# Patient Record
Sex: Male | Born: 1995 | Race: White | Hispanic: No | Smoking: Current some day smoker
Health system: Southern US, Community
[De-identification: ages and names within clinical notes are randomized; demographics above are authoritative.]

## PROBLEM LIST (undated history)

## (undated) DIAGNOSIS — J45909 Unspecified asthma, uncomplicated: Secondary | ICD-10-CM

---
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
## 2018-01-20 IMAGING — DX DG ANKLE COMPLETE 3+V*R*
3 series · 3 of 3 positions shown · non-contrast
Comparison: None.

CLINICAL DATA: Laceration right heel

EXAM:
RIGHT ANKLE - COMPLETE 3+ VIEW

[ankle ap]
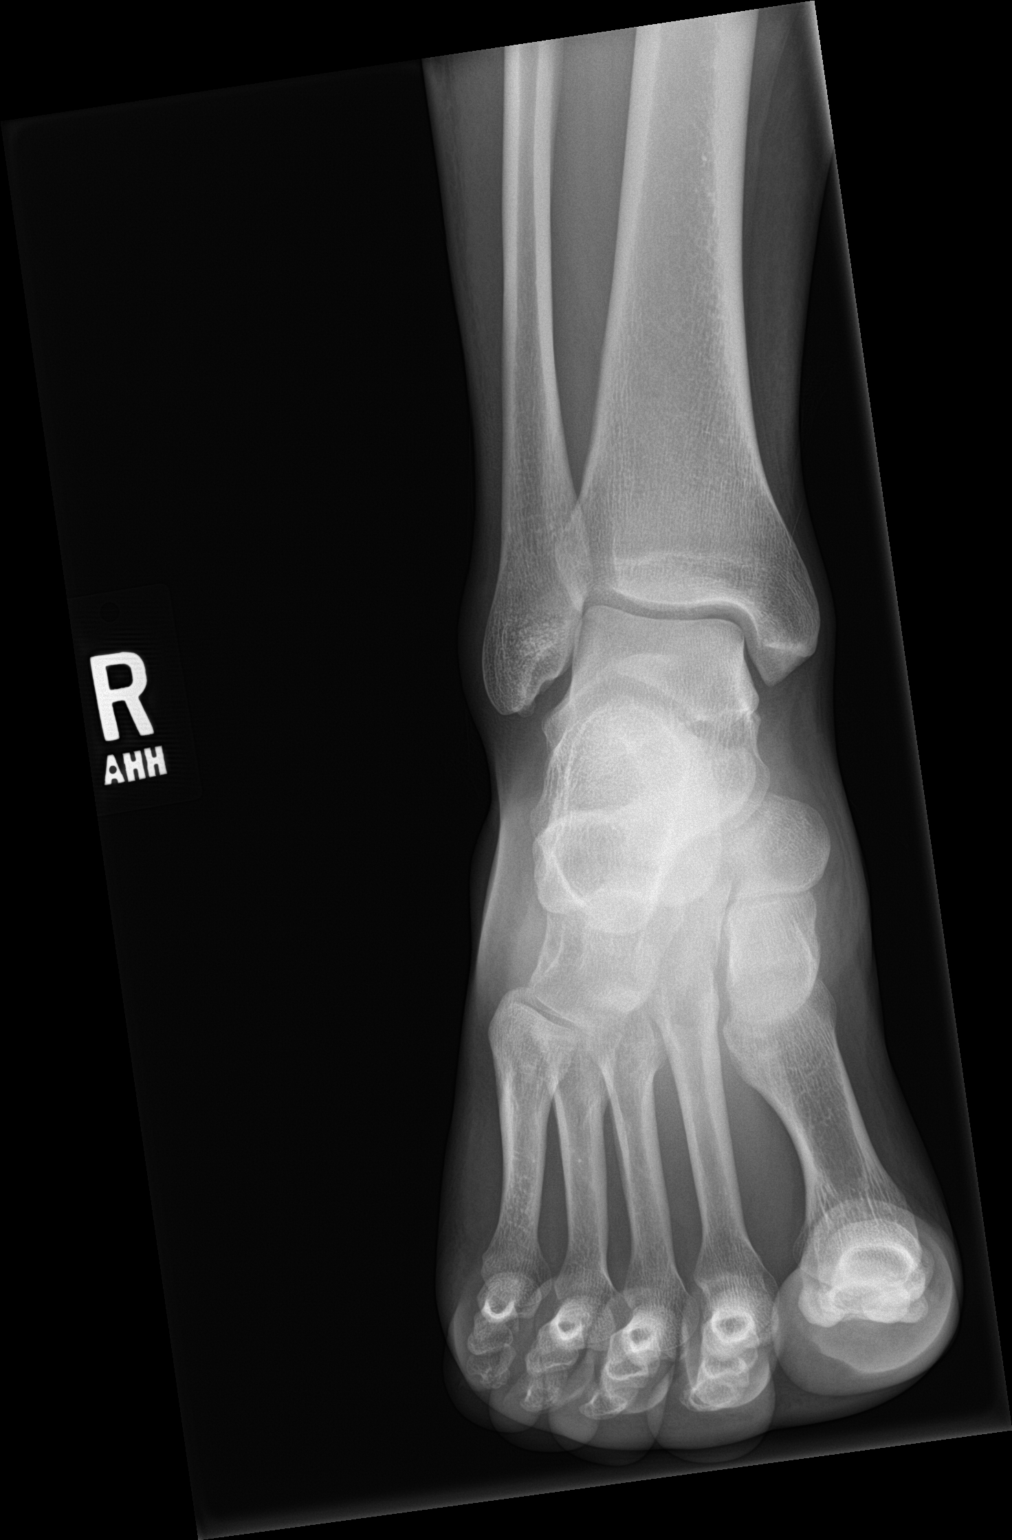

[ankle obl]
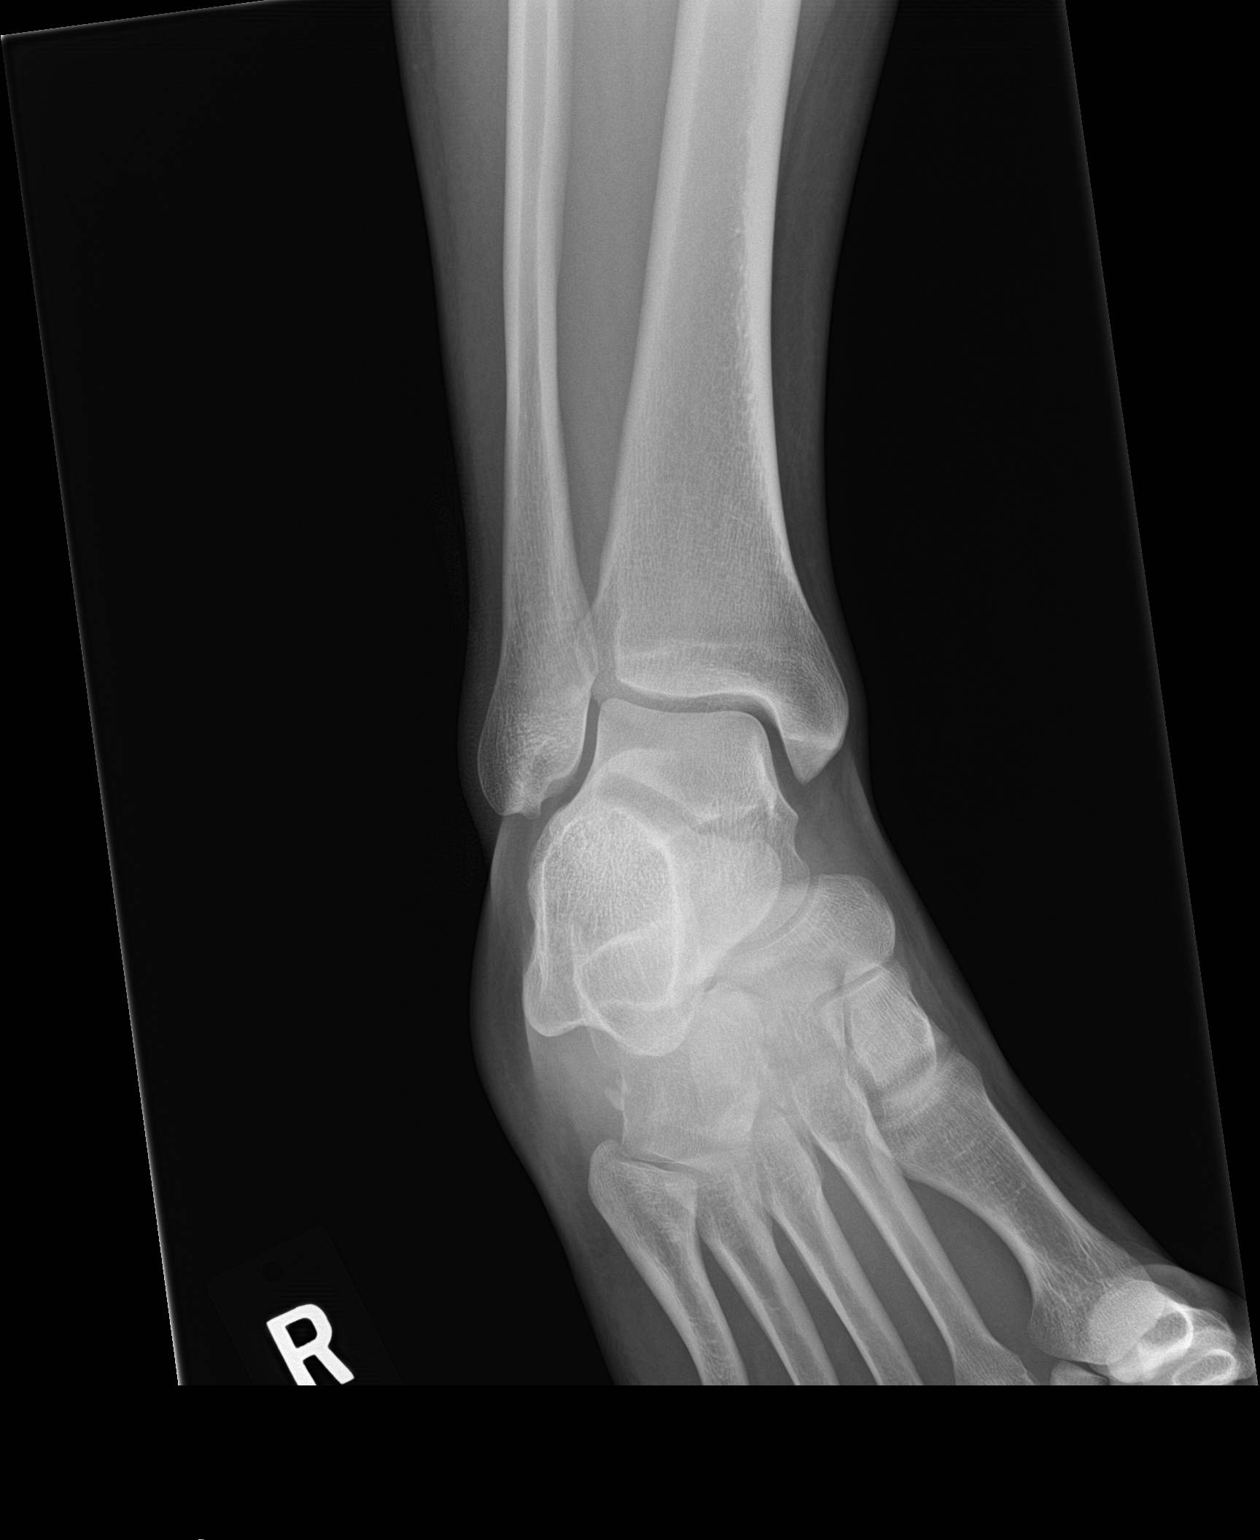

[ankle lat]
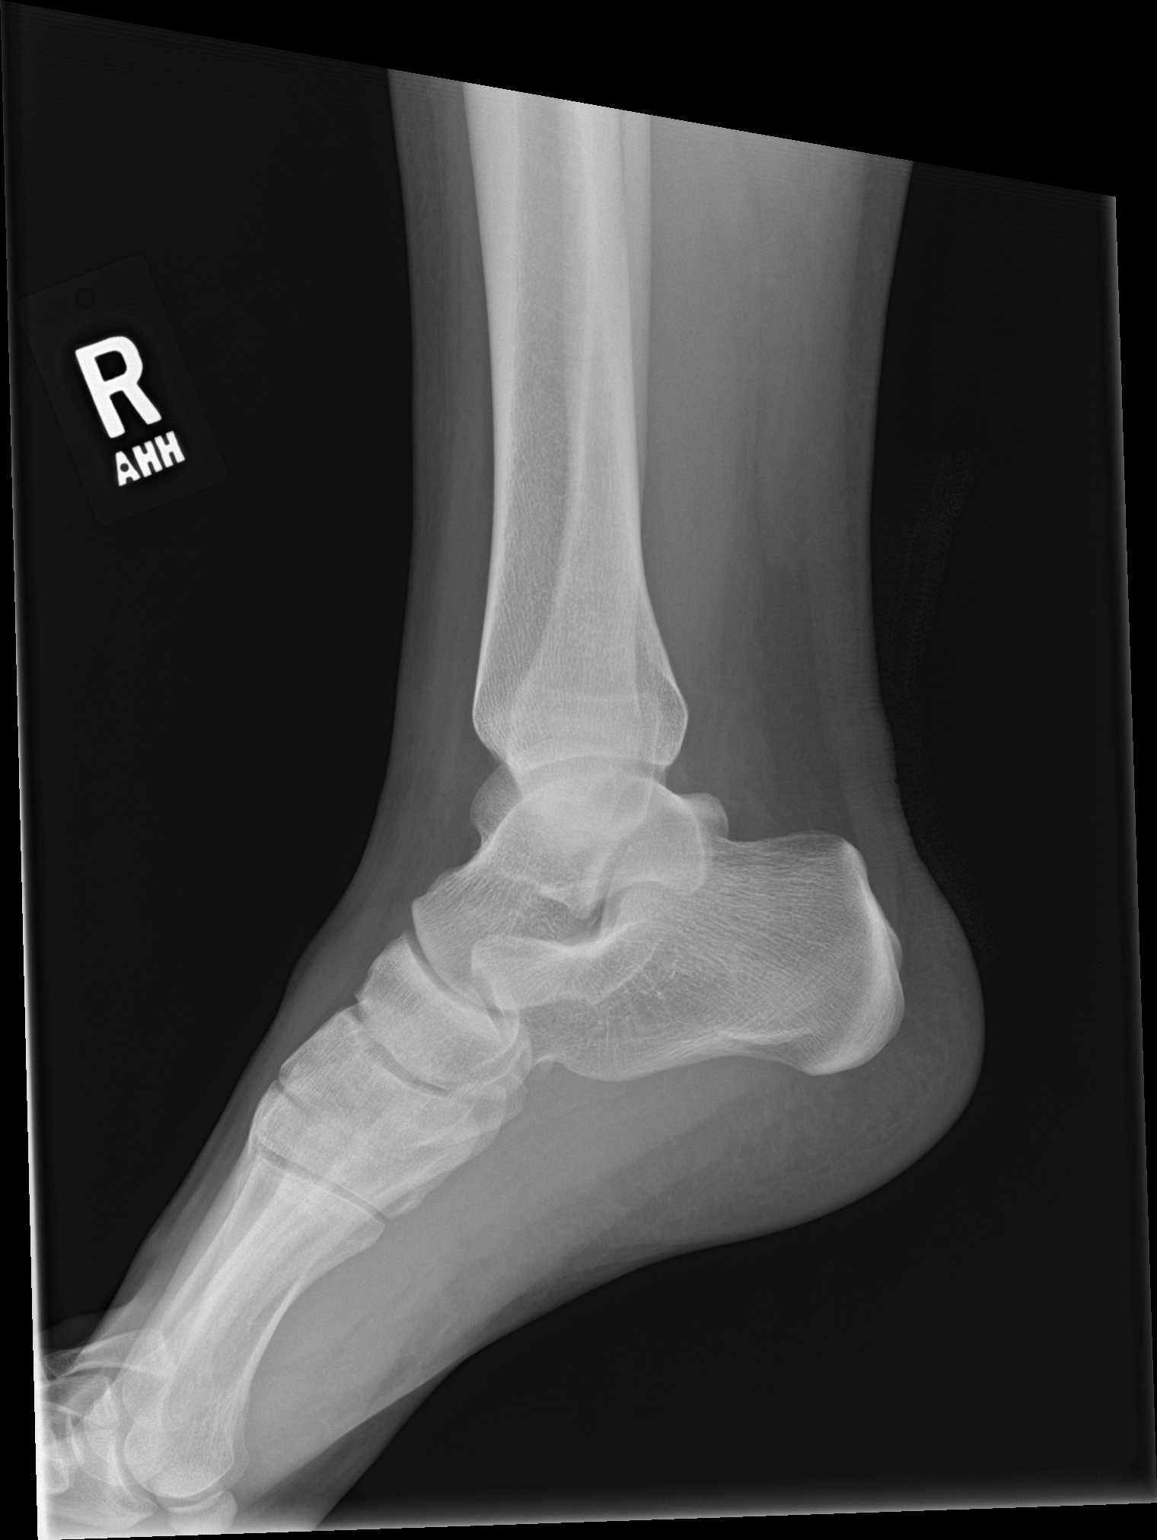

[3 of 3 positions shown; findings below may reference images not displayed]

FINDINGS: There is no evidence of fracture, dislocation, or joint effusion.
There is no evidence of arthropathy or other focal bone abnormality.
Soft tissues are unremarkable. Negative foreign body.
IMPRESSION: Negative.

## 2023-08-26 ENCOUNTER — Other Ambulatory Visit: Payer: Self-pay

## 2023-08-26 ENCOUNTER — Ambulatory Visit (INDEPENDENT_AMBULATORY_CARE_PROVIDER_SITE_OTHER): Payer: 59 | Admitting: Internal Medicine

## 2023-08-26 ENCOUNTER — Encounter: Payer: Self-pay | Admitting: Internal Medicine

## 2023-08-26 VITALS — BP 150/102 | HR 96 | Temp 98.1°F | Resp 16 | Ht 67.72 in | Wt 247.1 lb

## 2023-08-26 DIAGNOSIS — J3089 Other allergic rhinitis: Secondary | ICD-10-CM

## 2023-08-26 DIAGNOSIS — J4520 Mild intermittent asthma, uncomplicated: Secondary | ICD-10-CM | POA: Diagnosis not present

## 2023-08-26 MED ORDER — ALBUTEROL SULFATE HFA 108 (90 BASE) MCG/ACT IN AERS: 2.0000 | INHALATION_SPRAY | RESPIRATORY_TRACT | 1 refills | Status: AC | PRN

## 2023-08-26 MED ORDER — CETIRIZINE HCL 10 MG PO TABS: 10.0000 mg | ORAL_TABLET | Freq: Every day | ORAL | 5 refills | Status: AC | PRN

## 2023-08-26 MED ORDER — ALBUTEROL SULFATE (2.5 MG/3ML) 0.083% IN NEBU: 2.5000 mg | INHALATION_SOLUTION | Freq: Four times a day (QID) | RESPIRATORY_TRACT | 1 refills | Status: AC | PRN

## 2023-08-26 NOTE — Progress Notes (Signed)
NEW PATIENT  Date of Service/Encounter:  08/26/23  Consult requested by: No primary care provider on file.   Subjective:   Pedro Fox (DOB: [birth year]) is a 28 y.o. male who presents to the clinic on 08/26/2023 with a chief complaint of Allergies (Has seasonal allergies that come up during spring.) and Asthma (Says his asthma attacks started up when he started using the gas heater in his new apartment. Has an albuterol inhaler and a nebulizer and states that the nebulizer works better.) .    History obtained from: chart review and patient.   Asthma:  Diagnosed at age 10.  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  Has trouble with SOB/chest tightness at the time.  Uses Albuterol 1x/week or less. No urgent care/ER visits/oral prednisone in last year.  Once a week or less daytime symptoms in past month, none nighttime awakenings in past month Using rescue inhaler: about once a week Limitations to daily activity: none 0 ED visits/UC visits and 0 oral steroids in the past year 0 number of lifetime hospitalizations, 0 number of lifetime intubations.  Identified Triggers: gas heater in apartment, dust, allergies Prior PFTs or spirometry: none available for review  Previously used therapies: never on controller inhaler  Current regimen:  Maintenance: none  Rescue: Albuterol 2 puffs or nebulizer q4-6 hrs PRN  Rhinitis:  Started since he was young, age 20-7.  Symptoms include: nasal congestion, rhinorrhea, post nasal drainage, and sneezing  Occurs seasonally-Spring Potential triggers: not sure  Treatments tried:  PRN OTC meds  Previous allergy testing: no History of sinus surgery: no Nonallergic triggers: gas heater     Reviewed:  08/26/2021: underwent TB screening with [facility], negative results.   04/27/2020: seen by Katrinka Blazing PA for annual physical exam.   No PMH/PSH/Meds.  Past Medical History: Past Medical History:  Diagnosis Date   Asthma    pt states it is only w/ pollen   Past Surgical History: History reviewed. No pertinent surgical history.  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 If you have any questions, ask your nurse or doctor.          albuterol 108 (90 Base) MCG/ACT inhaler Commonly known as: VENTOLIN HFA Inhale 2 puffs into the lungs every 4 (four) hours as needed for shortness of breath or wheezing.         REVIEW OF SYSTEMS: Pertinent positives and negatives discussed in HPI.   Objective:   Physical Exam: BP (!) 144/110 (BP Location: Right Arm, Patient Position: Sitting, Cuff Size: Normal)   Pulse 96   Temp 98.1 F (36.7 C) (Temporal)   Resp 16   Ht 5' 7.72" (1.72 m)   Wt 247 lb 1.6 oz (112.1 kg)   SpO2 97%   BMI 37.89 kg/m  Body mass index is 37.89 kg/m. GEN: alert, well developed HEENT: clear conjunctiva, nose with + mild inferior turbinate  hypertrophy, pink nasal mucosa, slight clear rhinorrhea, no cobblestoning HEART: regular rate and rhythm, no murmur LUNGS: clear to auscultation bilaterally, no coughing, unlabored respiration ABDOMEN: soft, non distended  SKIN: no rashes or lesions  Spirometry:  Tracings reviewed. His effort: Good reproducible efforts. FVC: 5.05L, 99% predicted FEV1: 3.59L, 84% predicted FEV1/FVC ratio: 71% Interpretation: Spirometry consistent with normal pattern.  Please see scanned spirometry results for details.  Assessment:   1. Other allergic rhinitis   2. Mild intermittent asthma  without complication     Plan/Recommendations:  Mild Intermittent Asthma: - MDI technique discussed.  Spirometry today was normal. Infrequent symptoms with weekly or less Albuterol use.   - Keep track of symptoms and Albuterol use.  - Rescue inhaler: Albuterol 2 puffs or 1 vial via nebulizer every 4-6 hours as needed for respiratory symptoms of shortness of breath, or wheezing Asthma control goals:  Full participation in all desired activities (may need albuterol before activity) Albuterol use two times or less a week on average (not counting use with activity) Cough interfering with sleep two times or less a month Oral steroids no more than once a year No hospitalizations   Other Allergic Rhinitis: - Due to turbinate hypertrophy, seasonal symptoms and unresponsive to over the counter meds, will perform skin testing to identify aeroallergen triggers.   - Use nasal saline rinses before nose sprays such as with Neilmed Sinus Rinse.  Use distilled water.   - Use Zyrtec 10 mg daily as needed for runny nose, sneezing, itchy watery eyes.    Hold all anti-histamines (Xyzal, Allegra, Zyrtec, Claritin, Benadryl, Pepcid) 3 days prior to next visit.    Follow up: 130 PM on 1/29 for skin testing 1-55, IDs okay    Alesia Morin, MD [facility]

## 2023-08-26 NOTE — Patient Instructions (Addendum)
Mild Intermittent Asthma: - Keep track of symptoms and Albuterol use.  - Rescue inhaler: Albuterol 2 puffs or 1 vial via nebulizer every 4-6 hours as needed for respiratory symptoms of shortness of breath, or wheezing Asthma control goals:  Full participation in all desired activities (may need albuterol before activity) Albuterol use two times or less a week on average (not counting use with activity) Cough interfering with sleep two times or less a month Oral steroids no more than once a year No hospitalizations   Other Allergic Rhinitis: - Use nasal saline rinses before nose sprays such as with Neilmed Sinus Rinse.  Use distilled water.   - Use Zyrtec 10 mg daily as needed for runny nose, sneezing, itchy watery eyes.    Hold all anti-histamines (Xyzal, Allegra, Zyrtec, Claritin, Benadryl, Pepcid) 3 days prior to next visit.    Follow up: 130 PM on 1/29 for skin testing 1-55

## 2023-09-02 ENCOUNTER — Ambulatory Visit (INDEPENDENT_AMBULATORY_CARE_PROVIDER_SITE_OTHER): Payer: 59 | Admitting: Internal Medicine

## 2023-09-02 DIAGNOSIS — J3081 Allergic rhinitis due to animal (cat) (dog) hair and dander: Secondary | ICD-10-CM | POA: Diagnosis not present

## 2023-09-02 DIAGNOSIS — J301 Allergic rhinitis due to pollen: Secondary | ICD-10-CM | POA: Diagnosis not present

## 2023-09-02 DIAGNOSIS — J3089 Other allergic rhinitis: Secondary | ICD-10-CM

## 2023-09-02 MED ORDER — AZELASTINE HCL 0.1 % NA SOLN: 2.0000 | Freq: Two times a day (BID) | NASAL | 5 refills | Status: AC | PRN

## 2023-09-02 NOTE — Patient Instructions (Addendum)
Allergic Rhinitis: - Positive skin test 08/2023: trees, grasses, weeds, dusts mites, cats  - Avoidance measures discussed. - Use nasal saline rinses before nose sprays such as with Neilmed Sinus Rinse.  Use distilled water.   - Use Azelastine 2 sprays each nostril twice daily as needed for runny nose, drainage, sneezing, congestion. Aim upward and outward. - Use Zyrtec 10 mg daily.  - Consider allergy shots as long term control of your symptoms by teaching your immune system to be more tolerant of your allergy triggers  Mild Intermittent Asthma: - Keep track of symptoms and Albuterol use.  - Rescue inhaler: Albuterol 2 puffs or 1 vial via nebulizer every 4-6 hours as needed for respiratory symptoms of shortness of breath, or wheezing Asthma control goals:  Full participation in all desired activities (may need albuterol before activity) Albuterol use two times or less a week on average (not counting use with activity) Cough interfering with sleep two times or less a month Oral steroids no more than once a year No hospitalizations   ALLERGEN AVOIDANCE MEASURES   Dust Mites Use central air conditioning and heat; and change the filter monthly.  Pleated filters work better than mesh filters.  Electrostatic filters may also be used; wash the filter monthly.  Window air conditioners may be used, but do not clean the air as well as a central air conditioner.  Change or wash the filter monthly. Keep windows closed.  Do not use attic fans.   Encase the mattress, box springs and pillows with zippered, dust proof covers. Wash the bed linens in hot water weekly.   Remove carpet, especially from the bedroom. Remove stuffed animals, throw pillows, dust ruffles, heavy drapes and other items that collect dust from the bedroom. Do not use a humidifier.   Use wood, vinyl or leather furniture instead of cloth furniture in the bedroom. Keep the indoor humidity at 30 - 40%.   Pollen Avoidance Pollen  levels are highest during the mid-day and afternoon.  Consider this when planning outdoor activities. Avoid being outside when the grass is being mowed, or wear a mask if the pollen-allergic person must be the one to mow the grass. Keep the windows closed to keep pollen outside of the home. Use an air conditioner to filter the air. Take a shower, wash hair, and change clothing after working or playing outdoors during pollen season. Pet Dander- Cats  Keep the pet out of your bedroom and restrict it to only a few rooms. Be advised that keeping the pet in only one room will not limit the allergens to that room. Don't pet, hug or kiss the pet; if you do, wash your hands with soap and water. High-efficiency particulate air (HEPA) cleaners run continuously in a bedroom or living room can reduce allergen levels over time. Regular use of a high-efficiency vacuum cleaner or a central vacuum can reduce allergen levels. Giving your pet a bath at least once a week can reduce airborne allergen.

## 2023-09-02 NOTE — Progress Notes (Signed)
FOLLOW UP Date of Service/Encounter:  09/02/23   Subjective:  Pedro Fox (DOB: Dec 14, 1995) is a 28 y.o. male who [facility] for follow up for skin testing.   History obtained from: chart review and patient.  Anti histamines held.   Past Medical History: Past Medical History:  Diagnosis Date   Asthma    pt states it is only w/ pollen    Objective:  There were no vitals taken for this visit. There is no height or weight on file to calculate BMI. Physical Exam: GEN: alert, well developed HEENT: clear conjunctiva, MMM LUNGS: unlabored respiration Skin Testing:  Skin prick testing was placed, which includes aeroallergens/foods, histamine control, and saline control.  Verbal consent was obtained prior to placing test.  Patient tolerated procedure well.  Allergy testing results were read and interpreted by myself, documented by clinical staff. Adequate positive and negative control.  Positive results to:  Results discussed with patient/family.  Airborne Adult Perc - 09/02/23 1300     Time Antigen Placed 1335    Allergen Manufacturer Waynette Buttery    Location Back    Number of Test 55    1. Control-Buffer 50% Glycerol Negative    2. Control-Histamine 3+    3. Bahia 3+    4. [place] 2+    5. Johnson 3+    6. [place] Blue 3+    7. Meadow Fescue 2+    8. Perennial Rye 3+    9. Timothy 3+    10. Ragweed Mix 2+    11. Cocklebur Negative    12. Plantain,  English 3+    13. Baccharis Negative    14. Dog Fennel 2+    15. Russian Thistle 3+    16. Lamb's Quarters 3+    17. Sheep Sorrell Negative    18. Rough Pigweed 2+    19. Marsh Elder, Rough 3+    20. Mugwort, Common Negative    21. Box, Elder 3+    22. Cedar, red Negative    23. Sweet Gum 3+    24. Pecan Pollen 3+    25. Pine Mix Negative    26. Walnut, Black Pollen 3+    27. Red Mulberry 2+    28. Ash Mix 3+    29. Birch Mix 3+    30. Beech American 3+    31. Cottonwood,  [place] 2+    32. Hickory, White 2+    33. Maple Mix Negative    34. Oak, [place] Mix 3+    35. Sycamore Eastern Negative    36. Alternaria Alternata Negative    37. Cladosporium Herbarum Negative    38. Aspergillus Mix Negative    39. Penicillium Mix Negative    40. Bipolaris Sorokiniana (Helminthosporium) Negative    41. Drechslera Spicifera (Curvularia) Negative    42. Mucor Plumbeus Negative    43. Fusarium Moniliforme Negative    44. Aureobasidium Pullulans (pullulara) Negative    45. Rhizopus Oryzae Negative    46. Botrytis Cinera Negative    47. Epicoccum Nigrum Negative    48. Phoma Betae Negative    49. Dust Mite Mix 3+    50. Cat Hair 10,000 BAU/ml 3+    51.  Dog Epithelia Negative    52. Mixed Feathers Negative    53. Horse Epithelia Negative    54. Cockroach, German Negative    55. Tobacco Leaf Negative  Intradermal - 09/02/23 1411     Time Antigen Placed 1411    Allergen Manufacturer Waynette Buttery    Location Arm    Number of Test 7    Control Negative    Mold 1 Negative    Mold 2 Negative    Mold 3 Negative    Mold 4 Negative    Dog Negative    Cockroach Negative              Assessment:   1. Seasonal allergic rhinitis due to pollen   2. 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 Allergic rhinitis due to dust mite     Plan/Recommendations:    Allergic Rhinitis: - Due to turbinate hypertrophy, seasonal symptoms, asthma and unresponsive to over the counter meds, will perform skin testing to identify aeroallergen triggers.   - Positive skin test 08/2023: trees, grasses, weeds, dusts mites, cats  - Avoidance measures discussed. - Use nasal saline rinses before nose sprays such as with Neilmed Sinus Rinse.  Use distilled water.   - Use Azelastine 2 sprays each nostril twice daily as needed for runny nose, drainage, sneezing, congestion. Aim upward and outward. - Use Zyrtec 10 mg daily.  - Consider allergy shots as long term  control of your symptoms by teaching your immune system to be more tolerant of your allergy triggers  Mild Intermittent Asthma: - Keep track of symptoms and Albuterol use.  - Rescue inhaler: Albuterol 2 puffs or 1 vial via nebulizer every 4-6 hours as needed for respiratory symptoms of shortness of breath, or wheezing Asthma control goals:  Full participation in all desired activities (may need albuterol before activity) Albuterol use two times or less a week on average (not counting use with activity) Cough interfering with sleep two times or less a month Oral steroids no more than once a year No hospitalizations   ALLERGEN AVOIDANCE MEASURES   Dust Mites Use central air conditioning and heat; and change the filter monthly.  Pleated filters work better than mesh filters.  Electrostatic filters may also be used; wash the filter monthly.  Window air conditioners may be used, but do not clean the air as well as a central air conditioner.  Change or wash the filter monthly. Keep windows closed.  Do not use attic fans.   Encase the mattress, box springs and pillows with zippered, dust proof covers. Wash the bed linens in hot water weekly.   Remove carpet, especially from the bedroom. Remove stuffed animals, throw pillows, dust ruffles, heavy drapes and other items that collect dust from the bedroom. Do not use a humidifier.   Use wood, vinyl or leather furniture instead of cloth furniture in the bedroom. Keep the indoor humidity at 30 - 40%.   Pollen Avoidance Pollen levels are highest during the mid-day and afternoon.  Consider this when planning outdoor activities. Avoid being outside when the grass is being mowed, or wear a mask if the pollen-allergic person must be the one to mow the grass. Keep the windows closed to keep pollen outside of the home. Use an air conditioner to filter the air. Take a shower, wash hair, and change clothing after working or playing outdoors during pollen  season. Pet Dander- Cats  Keep the pet out of your bedroom and restrict it to only a few rooms. Be advised that keeping the pet in only one room will not limit the allergens to that room. Don't pet, hug or kiss the pet;  if you do, wash your hands with soap and water. High-efficiency particulate air (HEPA) cleaners run continuously in a bedroom or living room can reduce allergen levels over time. Regular use of a high-efficiency vacuum cleaner or a central vacuum can reduce allergen levels. Giving your pet a bath at least once a week can reduce airborne allergen.    Return in about 2 months (around 10/31/2023).  Alesia Morin, MD [facility]

## 2023-09-14 NOTE — Progress Notes (Signed)
New Patient Office Visit  Subjective    Patient ID: Pedro Fox, male    DOB: 1995-12-02  Age: 28 y.o. MRN: [id number]  CC:  Chief Complaint  Patient presents with   Establish Care    Establish Care. Requesting physical. Patient has past history of asthma and been experiencing recent flare ups. Does see asthma and allergy Dr.Patel. Last seen PCP 2023 for work physical    HPI Pedro Fox presents to establish care with physical today. He sees Dr Allena Katz for asthma and allergy. Asthma is well controlled.  Requesting ENT referral for deviated septum. Does not take daily prescription medications. Sees eye dr regularly. Has appt with dentist upcoming in the next month or so.  Reports that he is eating well, sleeping well, feeling well overall.  Due for flu and PCV 20, would like to do this today. Reports that he is recently lost 10 pounds, states that his blood pressure has come down since his last visit asthma and allergy. Reports that he has changed to improve his diet to a more healthy diet, also reports that he is working out at the gym 3 times a week. Reports that he is eating well, sleeping well, feeling well overall. Denies other concerns today. Medical history as outlined below.    Outpatient Encounter Medications as of 09/15/2023  Medication Sig   albuterol (PROVENTIL) (2.5 MG/3ML) 0.083% nebulizer solution Take 3 mLs (2.5 mg total) by nebulization every 6 (six) hours as needed for shortness of breath or wheezing.   albuterol (VENTOLIN HFA) 108 (90 Base) MCG/ACT inhaler Inhale 2 puffs into the lungs every 4 (four) hours as needed for shortness of breath or wheezing.   azelastine (ASTELIN) 0.1 % nasal spray Place 2 sprays into both nostrils 2 (two) times daily as needed for allergies. Use in each nostril as directed   cetirizine (ZYRTEC ALLERGY) 10 MG tablet Take 1 tablet (10 mg total) by mouth daily as needed for allergies or rhinitis.   No facility-administered encounter  medications on file as of 09/15/2023.    Past Medical History:  Diagnosis Date   Asthma    pt states it is only w/ pollen    History reviewed. No pertinent surgical history.  Family History  Problem Relation Age of Onset   Asthma Mother    Allergic rhinitis Mother    Diabetes Paternal Grandmother     Social History   Socioeconomic History   Marital status: Unknown    Spouse name: Not on file   Number of children: Not on file   Years of education: Not on file   Highest education level: Not on file  Occupational History   Not on file  Tobacco Use   Smoking status: Some Days    Types: Cigars    Passive exposure: Past   Smokeless tobacco: Never  Vaping Use   Vaping status: Former  Substance and Sexual Activity   Alcohol use: No   Drug use: No   Sexual activity: Not on file  Other Topics Concern   Not on file  Social History Narrative   Not on file   Social Drivers of Health   Financial Resource Strain: Not on file  Food Insecurity: Not on file  Transportation Needs: Not on file  Physical Activity: Not on file  Stress: Not on file  Social Connections: Not on file  Intimate Partner Violence: Not on file    ROS Per HPI      Objective  BP 128/84   Pulse 95   Temp 98.2 F (36.8 C)   Ht 5' 7.72" (1.72 m)   Wt 245 lb 9.6 oz (111.4 kg)   SpO2 98%   BMI 37.65 kg/m   Physical Exam Vitals and nursing note reviewed.  Constitutional:      General: He is not in acute distress.    Appearance: Normal appearance. He is obese.  HENT:     Head: Normocephalic and atraumatic.     Right Ear: Tympanic membrane and ear canal normal.     Left Ear: Tympanic membrane and ear canal normal.     Nose: No congestion.     Comments: Deviated septum noted    Mouth/Throat:     Mouth: Mucous membranes are moist.     Pharynx: Oropharynx is clear.  Eyes:     Extraocular Movements: Extraocular movements intact.  Cardiovascular:     Rate and Rhythm: Normal rate and  regular rhythm.     Pulses: Normal pulses.     Heart sounds: Normal heart sounds.  Pulmonary:     Effort: Pulmonary effort is normal. No respiratory distress.     Breath sounds: Normal breath sounds. No wheezing, rhonchi or rales.  Abdominal:     General: Abdomen is flat. Bowel sounds are normal. There is no distension.     Palpations: Abdomen is soft.     Tenderness: There is no abdominal tenderness. There is no guarding.  Genitourinary:    Comments: Declines Musculoskeletal:        General: Normal range of motion.     Cervical back: Normal range of motion.  Lymphadenopathy:     Cervical: No cervical adenopathy.  Skin:    General: Skin is warm and dry.     Capillary Refill: Capillary refill takes less than 2 seconds.     Comments: Multiple tattoos  Neurological:     General: No focal deficit present.     Mental Status: He is alert and oriented to person, place, and time.  Psychiatric:        Mood and Affect: Mood normal.        Behavior: Behavior normal.        Assessment & Plan:   Well adult exam -     Comprehensive metabolic panel -     CBC with Differential/Platelet  Seasonal allergies -     Comprehensive metabolic panel -     CBC with Differential/Platelet  Encounter for screening for cardiovascular disorders -     Lipid panel -     Comprehensive metabolic panel -     CBC with Differential/Platelet  Immunization due -     Flu vaccine trivalent PF, 6mos and older(Flulaval,Afluria,Fluarix,Fluzone)  Mild persistent asthma with acute exacerbation -     Comprehensive metabolic panel -     CBC with Differential/Platelet -     Flu vaccine trivalent PF, 6mos and older(Flulaval,Afluria,Fluarix,Fluzone)  Deviated septum -     Ambulatory referral to ENT  - Out of stock of PCV 20, will get this at next visit  Return in about 1 year (around 09/14/2024) for physical.   Moshe Cipro, FNP

## 2023-09-15 ENCOUNTER — Ambulatory Visit (INDEPENDENT_AMBULATORY_CARE_PROVIDER_SITE_OTHER): Payer: 59 | Admitting: Family Medicine

## 2023-09-15 ENCOUNTER — Encounter: Payer: Self-pay | Admitting: Family Medicine

## 2023-09-15 VITALS — BP 128/84 | HR 95 | Temp 98.2°F | Ht 67.72 in | Wt 245.6 lb

## 2023-09-15 DIAGNOSIS — J342 Deviated nasal septum: Secondary | ICD-10-CM

## 2023-09-15 DIAGNOSIS — Z23 Encounter for immunization: Secondary | ICD-10-CM | POA: Diagnosis not present

## 2023-09-15 DIAGNOSIS — Z136 Encounter for screening for cardiovascular disorders: Secondary | ICD-10-CM

## 2023-09-15 DIAGNOSIS — J4531 Mild persistent asthma with (acute) exacerbation: Secondary | ICD-10-CM | POA: Diagnosis not present

## 2023-09-15 DIAGNOSIS — Z0000 Encounter for general adult medical examination without abnormal findings: Secondary | ICD-10-CM | POA: Diagnosis not present

## 2023-09-15 DIAGNOSIS — J302 Other seasonal allergic rhinitis: Secondary | ICD-10-CM

## 2023-09-15 LAB — COMPREHENSIVE METABOLIC PANEL
ALT: 79 U/L — ABNORMAL HIGH (ref 0–53)
AST: 31 U/L (ref 0–37)
Albumin: 4.8 g/dL (ref 3.5–5.2)
Alkaline Phosphatase: 73 U/L (ref 39–117)
BUN: 8 mg/dL (ref 6–23)
CO2: 27 meq/L (ref 19–32)
Calcium: 9.3 mg/dL (ref 8.4–10.5)
Chloride: 105 meq/L (ref 96–112)
Creatinine, Ser: 0.87 mg/dL (ref 0.40–1.50)
GFR: 119.58 mL/min (ref >60.00)
Glucose, Bld: 97 mg/dL (ref 70–99)
Potassium: 3.8 meq/L (ref 3.5–5.1)
Sodium: 140 meq/L (ref 135–145)
Total Bilirubin: 0.7 mg/dL (ref 0.2–1.2)
Total Protein: 7.4 g/dL (ref 6.0–8.3)

## 2023-09-15 LAB — CBC WITH DIFFERENTIAL/PLATELET
Basophils Absolute: 0 10*3/uL (ref 0.0–0.1)
Basophils Relative: 0.8 % (ref 0.0–3.0)
Eosinophils Absolute: 0.1 10*3/uL (ref 0.0–0.7)
Eosinophils Relative: 1.1 % (ref 0.0–5.0)
HCT: 47.7 % (ref 39.0–52.0)
Hemoglobin: 16.1 g/dL (ref 13.0–17.0)
Lymphocytes Relative: 34.7 % (ref 12.0–46.0)
Lymphs Abs: 2 10*3/uL (ref 0.7–4.0)
MCHC: 33.7 g/dL (ref 30.0–36.0)
MCV: 88.9 fL (ref 78.0–100.0)
Monocytes Absolute: 0.4 10*3/uL (ref 0.1–1.0)
Monocytes Relative: 6.2 % (ref 3.0–12.0)
Neutro Abs: 3.4 10*3/uL (ref 1.4–7.7)
Neutrophils Relative %: 57.2 % (ref 43.0–77.0)
Platelets: 200 10*3/uL (ref 150.0–400.0)
RBC: 5.36 Mil/uL (ref 4.22–5.81)
RDW: 12.7 % (ref 11.5–15.5)
WBC: 5.9 10*3/uL (ref 4.0–10.5)

## 2023-09-15 LAB — LIPID PANEL
Cholesterol: 145 mg/dL (ref 0–200)
HDL: 44.7 mg/dL (ref >39.00)
LDL Cholesterol: 84 mg/dL (ref 0–99)
NonHDL: 100.58
Total CHOL/HDL Ratio: 3
Triglycerides: 83 mg/dL (ref 0.0–149.0)
VLDL: 16.6 mg/dL (ref 0.0–40.0)

## 2023-09-15 NOTE — Patient Instructions (Signed)
Welcome to [facility]!  We are checking labs today, will be in contact with any results that require further attention  We have completed your physical today.  Follow up with me in a year for another physical, sooner if needed.

## 2023-10-27 NOTE — Patient Instructions (Incomplete)
 Allergic Rhinitis: - Positive skin test 08/2023: trees, grasses, weeds, dusts mites, cats  - Continue avoidance measures . - Use nasal saline rinses before nose sprays such as with Neilmed Sinus Rinse.  Use distilled water.   - Use Azelastine 2 sprays each nostril twice daily as needed for runny nose, drainage, sneezing, congestion. Aim upward and outward. - Use Zyrtec 10 mg daily.  - Consider allergy shots as long term control of your symptoms by teaching your immune system to be more tolerant of your allergy triggers  Mild Intermittent Asthma: - Keep track of symptoms and Albuterol use.  - Rescue inhaler: Albuterol 2 puffs or 1 vial via nebulizer every 4-6 hours as needed for respiratory symptoms of shortness of breath, or wheezing Asthma control goals:  Full participation in all desired activities (may need albuterol before activity) Albuterol use two times or less a week on average (not counting use with activity) Cough interfering with sleep two times or less a month Oral steroids no more than once a year No hospitalizations   Follow up in months or sooner if needed  ALLERGEN AVOIDANCE MEASURES   Dust Mites Use central air conditioning and heat; and change the filter monthly.  Pleated filters work better than mesh filters.  Electrostatic filters may also be used; wash the filter monthly.  Window air conditioners may be used, but do not clean the air as well as a central air conditioner.  Change or wash the filter monthly. Keep windows closed.  Do not use attic fans.   Encase the mattress, box springs and pillows with zippered, dust proof covers. Wash the bed linens in hot water weekly.   Remove carpet, especially from the bedroom. Remove stuffed animals, throw pillows, dust ruffles, heavy drapes and other items that collect dust from the bedroom. Do not use a humidifier.   Use wood, vinyl or leather furniture instead of cloth furniture in the bedroom. Keep the indoor humidity at  30 - 40%.   Pollen Avoidance Pollen levels are highest during the mid-day and afternoon.  Consider this when planning outdoor activities. Avoid being outside when the grass is being mowed, or wear a mask if the pollen-allergic person must be the one to mow the grass. Keep the windows closed to keep pollen outside of the home. Use an air conditioner to filter the air. Take a shower, wash hair, and change clothing after working or playing outdoors during pollen season. Pet Dander- Cats  Keep the pet out of your bedroom and restrict it to only a few rooms. Be advised that keeping the pet in only one room will not limit the allergens to that room. Don't pet, hug or kiss the pet; if you do, wash your hands with soap and water. High-efficiency particulate air (HEPA) cleaners run continuously in a bedroom or living room can reduce allergen levels over time. Regular use of a high-efficiency vacuum cleaner or a central vacuum can reduce allergen levels. Giving your pet a bath at least once a week can reduce airborne allergen.

## 2023-10-28 ENCOUNTER — Other Ambulatory Visit: Payer: Self-pay

## 2023-10-28 ENCOUNTER — Encounter: Payer: Self-pay | Admitting: Family

## 2023-10-28 ENCOUNTER — Ambulatory Visit (INDEPENDENT_AMBULATORY_CARE_PROVIDER_SITE_OTHER): Payer: 59 | Admitting: Family

## 2023-10-28 VITALS — BP 120/82 | HR 96 | Temp 98.2°F | Resp 16

## 2023-10-28 DIAGNOSIS — J301 Allergic rhinitis due to pollen: Secondary | ICD-10-CM | POA: Diagnosis not present

## 2023-10-28 DIAGNOSIS — J4520 Mild intermittent asthma, uncomplicated: Secondary | ICD-10-CM | POA: Diagnosis not present

## 2023-10-28 DIAGNOSIS — J3089 Other allergic rhinitis: Secondary | ICD-10-CM | POA: Diagnosis not present

## 2023-10-28 DIAGNOSIS — J3081 Allergic rhinitis due to animal (cat) (dog) hair and dander: Secondary | ICD-10-CM

## 2023-10-28 NOTE — Progress Notes (Signed)
 [address] [place] [address] Dept: [phone]  FOLLOW UP NOTE  Patient ID: Ranald Alessio, male    DOB: 1996-02-19  Age: 28 y.o. MRN: [id number] Date of Office Visit: 10/28/2023  Assessment  Chief Complaint: Asthma and Allergic Rhinitis   HPI Heidi Lemay is a 28 year old male who presents today for follow-up of seasonal allergic rhinitis due to pollen, allergic rhinitis due to animal hair dander, allergic rhinitis due to dust mite and mild intermittent asthma.  He was last seen on September 02, 2023 by Dr. Allena Katz.  He denies any new diagnosis or surgery since his last office visit.  Allergic rhinitis: He reports a little bit of rhinorrhea that is clear in color since the start of pollen last week.  He has also had a little bit of sneezing.  He denies nasal congestion, postnasal drip and sinus pressure.  He has not been treated for any sinus infections since we last saw him.  He is currently taking Claritin just as needed.  He took a Claritin last week.  He has not using azelastine nasal spray.  He denies itchy watery eyes.  Mild intermittent asthma: He reports tightness in his chest here and there.  He will burp and the tightness goes away.  He denies cough, wheeze, shortness of breath, and nocturnal awakenings due to breathing problems.  Since his last office visit he has not required any systemic steroids or made any trips to the emergency room or urgent care due to breathing problems.  He has not had to use his albuterol inhaler since his last follow-up appointment.   Drug Allergies:  No Known Allergies  Review of Systems: Negative except as per HPI   Physical Exam: BP 120/82 (BP Location: Left Arm, Patient Position: Sitting, Cuff Size: Large)   Pulse 96   Temp 98.2 F (36.8 C) (Temporal)   Resp 16   SpO2 96%    Physical Exam Constitutional:      Appearance: Normal appearance.  HENT:     Head: Normocephalic and atraumatic.     Comments: Pharynx normal, eyes normal, ears  normal, nose normal    Right Ear: Tympanic membrane, ear canal and external ear normal.     Left Ear: Tympanic membrane, ear canal and external ear normal.     Nose: Nose normal.     Mouth/Throat:     Mouth: Mucous membranes are moist.     Pharynx: Oropharynx is clear.  Eyes:     Conjunctiva/sclera: Conjunctivae normal.  Cardiovascular:     Rate and Rhythm: Regular rhythm.     Heart sounds: Normal heart sounds.  Pulmonary:     Effort: Pulmonary effort is normal.     Breath sounds: Normal breath sounds.     Comments: Lungs clear to auscultation Musculoskeletal:     Cervical back: Neck supple.  Skin:    General: Skin is warm.  Neurological:     Mental Status: He is alert and oriented to person, place, and time.  Psychiatric:        Mood and Affect: Mood normal.        Behavior: Behavior normal.        Thought Content: Thought content normal.        Judgment: Judgment normal.     Diagnostics: FVC 5.28 L (103%), FEV1 3.80 L (88%), FEV1/FVC 0.72.  Spirometry indicates normal spirometry.  Assessment and Plan: 1. Seasonal allergic rhinitis due to pollen   2. Allergic rhinitis due to  animal hair or dander   3. Allergic rhinitis due to dust mite   4. Mild intermittent asthma without complication     No orders of the defined types were placed in this encounter.   Patient Instructions  Allergic Rhinitis: moderately controlled - Positive skin test 08/2023: trees, grasses, weeds, dusts mites, cats  - Continue avoidance measures . - Use nasal saline rinses before nose sprays such as with Neilmed Sinus Rinse.  Use distilled water.   - Use Azelastine 2 sprays each nostril twice daily as needed for runny nose, drainage, sneezing, congestion. Aim upward and outward. - Use Claritin 10 mg daily.  - Consider allergy shots as long term control of your symptoms by teaching your immune system to be more tolerant of your allergy triggers  Mild Intermittent Asthma: - Keep track of symptoms  and Albuterol use.  - Rescue inhaler: Albuterol 2 puffs or 1 vial via nebulizer every 4-6 hours as needed for respiratory symptoms of shortness of breath, or wheezing Asthma control goals:  Full participation in all desired activities (may need albuterol before activity) Albuterol use two times or less a week on average (not counting use with activity) Cough interfering with sleep two times or less a month Oral steroids no more than once a year No hospitalizations   Follow up in 6 months or sooner if needed  ALLERGEN AVOIDANCE MEASURES   Dust Mites Use central air conditioning and heat; and change the filter monthly.  Pleated filters work better than mesh filters.  Electrostatic filters may also be used; wash the filter monthly.  Window air conditioners may be used, but do not clean the air as well as a central air conditioner.  Change or wash the filter monthly. Keep windows closed.  Do not use attic fans.   Encase the mattress, box springs and pillows with zippered, dust proof covers. Wash the bed linens in hot water weekly.   Remove carpet, especially from the bedroom. Remove stuffed animals, throw pillows, dust ruffles, heavy drapes and other items that collect dust from the bedroom. Do not use a humidifier.   Use wood, vinyl or leather furniture instead of cloth furniture in the bedroom. Keep the indoor humidity at 30 - 40%.   Pollen Avoidance Pollen levels are highest during the mid-day and afternoon.  Consider this when planning outdoor activities. Avoid being outside when the grass is being mowed, or wear a mask if the pollen-allergic person must be the one to mow the grass. Keep the windows closed to keep pollen outside of the home. Use an air conditioner to filter the air. Take a shower, wash hair, and change clothing after working or playing outdoors during pollen season. Pet Dander- Cats  Keep the pet out of your bedroom and restrict it to only a few rooms. Be advised  that keeping the pet in only one room will not limit the allergens to that room. Don't pet, hug or kiss the pet; if you do, wash your hands with soap and water. High-efficiency particulate air (HEPA) cleaners run continuously in a bedroom or living room can reduce allergen levels over time. Regular use of a high-efficiency vacuum cleaner or a central vacuum can reduce allergen levels. Giving your pet a bath at least once a week can reduce airborne allergen.  Return in about 6 months (around 04/29/2024), or if symptoms worsen or fail to improve.    Thank you for the opportunity to care for this patient.  Please do not  hesitate to contact me with questions.  Nehemiah Settle, FNP [facility]

## 2023-12-23 DIAGNOSIS — S93402A Sprain of unspecified ligament of left ankle, initial encounter: Secondary | ICD-10-CM | POA: Diagnosis not present

## 2023-12-23 DIAGNOSIS — M25572 Pain in left ankle and joints of left foot: Secondary | ICD-10-CM | POA: Diagnosis not present

## 2024-04-29 ENCOUNTER — Ambulatory Visit: Admitting: Internal Medicine
